# Patient Record
Sex: Male | Born: 1940 | Race: White | Hispanic: No | Marital: Single | Smoking: Former smoker
Health system: Southern US, Community
[De-identification: ages and names within clinical notes are randomized; demographics above are authoritative.]

## PROBLEM LIST (undated history)

## (undated) DIAGNOSIS — M1990 Unspecified osteoarthritis, unspecified site: Secondary | ICD-10-CM

## (undated) DIAGNOSIS — F329 Major depressive disorder, single episode, unspecified: Secondary | ICD-10-CM

## (undated) DIAGNOSIS — F32A Depression, unspecified: Secondary | ICD-10-CM

## (undated) DIAGNOSIS — F419 Anxiety disorder, unspecified: Secondary | ICD-10-CM

## (undated) DIAGNOSIS — F0390 Unspecified dementia without behavioral disturbance: Secondary | ICD-10-CM

## (undated) DIAGNOSIS — I219 Acute myocardial infarction, unspecified: Secondary | ICD-10-CM

## (undated) DIAGNOSIS — R0602 Shortness of breath: Secondary | ICD-10-CM

## (undated) DIAGNOSIS — G8929 Other chronic pain: Secondary | ICD-10-CM

## (undated) HISTORY — PX: CORONARY ANGIOPLASTY WITH STENT PLACEMENT: SHX49

## (undated) HISTORY — PX: PERCUTANEOUS PINNING TOE FRACTURE: SUR1018

## (undated) HISTORY — PX: ANKLE SURGERY: SHX546

---

## 1999-11-24 DIAGNOSIS — I219 Acute myocardial infarction, unspecified: Secondary | ICD-10-CM

## 1999-11-24 HISTORY — PX: CORONARY STENT PLACEMENT: SHX1402

## 1999-11-24 HISTORY — DX: Acute myocardial infarction, unspecified: I21.9

## 2010-09-05 ENCOUNTER — Encounter: Payer: Self-pay | Admitting: Urgent Care

## 2010-12-23 NOTE — Letter (Signed)
Summary: TRIAGE ORDER  TRIAGE ORDER   Imported By: Ave Filter 09/05/2010 16:28:55  _____________________________________________________________________  External Attachment:    Type:   Image     Comment:   External Document  Appended Document: TRIAGE ORDER Procedure will need to be done in OR due to polypharmacy  Appended Document: TRIAGE ORDER Pt's son states his fateher cant be ptu to sleep will need an av to discuss.

## 2011-06-11 ENCOUNTER — Inpatient Hospital Stay (HOSPITAL_COMMUNITY): Admission: RE | Admit: 2011-06-11 | Payer: Self-pay | Source: Ambulatory Visit

## 2011-06-15 ENCOUNTER — Ambulatory Visit (HOSPITAL_COMMUNITY): Admission: RE | Admit: 2011-06-15 | Payer: Medicare Other | Source: Ambulatory Visit | Admitting: Urology

## 2011-06-15 ENCOUNTER — Encounter (HOSPITAL_COMMUNITY): Admission: RE | Payer: Self-pay | Source: Ambulatory Visit

## 2011-06-15 SURGERY — MEATOTOMY, URETHRA, ADULT
Anesthesia: Choice

## 2011-12-16 ENCOUNTER — Other Ambulatory Visit: Payer: Self-pay

## 2011-12-16 ENCOUNTER — Emergency Department (HOSPITAL_COMMUNITY): Payer: Medicare Other

## 2011-12-16 ENCOUNTER — Emergency Department (HOSPITAL_COMMUNITY)
Admission: EM | Admit: 2011-12-16 | Discharge: 2011-12-16 | Disposition: A | Discharge status: Home / Self Care | Payer: Medicare Other | Attending: Emergency Medicine | Admitting: Emergency Medicine

## 2011-12-16 ENCOUNTER — Encounter (HOSPITAL_COMMUNITY): Payer: Self-pay

## 2011-12-16 DIAGNOSIS — F329 Major depressive disorder, single episode, unspecified: Secondary | ICD-10-CM | POA: Insufficient documentation

## 2011-12-16 DIAGNOSIS — Z0389 Encounter for observation for other suspected diseases and conditions ruled out: Secondary | ICD-10-CM | POA: Insufficient documentation

## 2011-12-16 DIAGNOSIS — R531 Weakness: Secondary | ICD-10-CM

## 2011-12-16 DIAGNOSIS — F411 Generalized anxiety disorder: Secondary | ICD-10-CM | POA: Insufficient documentation

## 2011-12-16 DIAGNOSIS — F3289 Other specified depressive episodes: Secondary | ICD-10-CM | POA: Insufficient documentation

## 2011-12-16 HISTORY — DX: Anxiety disorder, unspecified: F41.9

## 2011-12-16 HISTORY — DX: Depression, unspecified: F32.A

## 2011-12-16 HISTORY — DX: Other chronic pain: G89.29

## 2011-12-16 HISTORY — DX: Major depressive disorder, single episode, unspecified: F32.9

## 2011-12-16 LAB — DIFFERENTIAL
Lymphocytes Relative: 29 % (ref 12–46)
Monocytes Absolute: 0.9 10*3/uL (ref 0.1–1.0)
Monocytes Relative: 10 % (ref 3–12)
Neutro Abs: 5.3 10*3/uL (ref 1.7–7.7)

## 2011-12-16 LAB — COMPREHENSIVE METABOLIC PANEL
AST: 25 U/L (ref 0–37)
BUN: 9 mg/dL (ref 6–23)
CO2: 38 mEq/L — ABNORMAL HIGH (ref 19–32)
Chloride: 96 mEq/L (ref 96–112)
Creatinine, Ser: 0.56 mg/dL (ref 0.50–1.35)
GFR calc non Af Amer: >90 mL/min (ref >90)
Total Bilirubin: 0.3 mg/dL (ref 0.3–1.2)

## 2011-12-16 LAB — CBC
HCT: 46 % (ref 39.0–52.0)
Hemoglobin: 15 g/dL (ref 13.0–17.0)
RBC: 4.57 MIL/uL (ref 4.22–5.81)
WBC: 8.9 10*3/uL (ref 4.0–10.5)

## 2011-12-16 LAB — RAPID URINE DRUG SCREEN, HOSP PERFORMED
Opiates: NOT DETECTED
Tetrahydrocannabinol: NOT DETECTED

## 2011-12-16 LAB — ETHANOL: Alcohol, Ethyl (B): <11 mg/dL (ref 0–11)
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
## 2011-12-16 NOTE — ED Notes (Signed)
Pt resting quietly. Cooperative.   No needs verbalized at this time.

## 2011-12-16 NOTE — ED Notes (Signed)
Manuel Bradley with ACT team at bedside for evaluation.

## 2011-12-16 NOTE — ED Notes (Signed)
Contact name Danley Danker Number:  [phone]

## 2011-12-16 NOTE — ED Notes (Signed)
Pt brought here by a friend.  Friend says for the past month pt has lost approx 20lbs, has been having visual and auditory hallucinations.  Reports can't remember anything, will fall asleep standing up.   Pt denies any SI or HI.
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
## 2011-12-16 NOTE — ED Notes (Signed)
Dr Judd Lien at bedside, explained to pt that he will be discharged.  Pt aware to follow up with case management tomorrow.  Contact information provided to pt.  Personal belongings returned to pt.

## 2011-12-16 NOTE — ED Notes (Signed)
Consulted with [employer] and voucher arranged for. Pt discharged to home.

## 2011-12-16 NOTE — ED Notes (Signed)
Spoke with pt's friend, Danley Danker, [phone] who brought pt to ED. Explained to Mr Hipkins that pt has been discharged as there is no medical reason to hold or admit pt.  Mr. Mearl Latin stated he would not come pick pt up, and would be unable to do so until at least noon tomorrow.

## 2011-12-16 NOTE — ED Notes (Signed)
Pt states that he has been seeing and hearing things x 3 weeks. Pt states that for example he was asleep and woke up and saw his best friend looking at him but when he got up the front door was locked. Pt also hears people talking to him at times. Pt alert and oriented x 3. Skin warm and dry. Color pink. Pt ambulates with no difficulty. Denies suicidal or homicidal ideations.

## 2011-12-17 NOTE — Progress Notes (Signed)
I was the case manager on call on 1/23 and I received a call from Dr Judd Lien from [facility] ED wanting me to help pt with some assistance at home. I called pt this AM to speak with him about what assistance he needs. A male answered the phone and stated that pt did not live there anymore. I asked if he had Mr Beazley's contact and he stated no. I am hence unable to follow up with patient as there is only one contact # in the records and that is the one I called.

## 2012-05-17 ENCOUNTER — Encounter (HOSPITAL_COMMUNITY): Payer: Self-pay | Admitting: *Deleted

## 2012-05-17 ENCOUNTER — Emergency Department (HOSPITAL_COMMUNITY)
Admission: EM | Admit: 2012-05-17 | Discharge: 2012-05-17 | Disposition: A | Discharge status: Home / Self Care | Payer: Medicare Other | Attending: Emergency Medicine | Admitting: Emergency Medicine

## 2012-05-17 DIAGNOSIS — L02413 Cutaneous abscess of right upper limb: Secondary | ICD-10-CM

## 2012-05-17 DIAGNOSIS — G8929 Other chronic pain: Secondary | ICD-10-CM | POA: Insufficient documentation

## 2012-05-17 DIAGNOSIS — Z87891 Personal history of nicotine dependence: Secondary | ICD-10-CM | POA: Insufficient documentation

## 2012-05-17 DIAGNOSIS — IMO0002 Reserved for concepts with insufficient information to code with codable children: Secondary | ICD-10-CM | POA: Insufficient documentation

## 2012-05-17 MED ORDER — SULFAMETHOXAZOLE-TRIMETHOPRIM 800-160 MG PO TABS: 1.0000 | ORAL_TABLET | Freq: Two times a day (BID) | ORAL | Status: AC

## 2012-05-17 MED ORDER — SULFAMETHOXAZOLE-TMP DS 800-160 MG PO TABS
1.0000 | ORAL_TABLET | Freq: Once | ORAL | Status: AC
  Administered 2012-05-17: 1 via ORAL
  Filled 2012-05-17: qty 1

## 2012-05-17 NOTE — ED Notes (Signed)
Patient with no complaints at this time. Respirations even and unlabored. Skin warm/dry. Discharge instructions reviewed with patient at this time. Patient given opportunity to voice concerns/ask questions. Patient discharged at this time and left Emergency Department with steady gait.   

## 2012-05-17 NOTE — ED Notes (Addendum)
Pt c/o ?abscess to right  forearm area that he noticed yesterday. Unsure of what happened. Pt also concerned that his blood pressure is reading low at triage 113/45

## 2012-05-17 NOTE — Discharge Instructions (Signed)
Abscess An abscess (boil or furuncle) is an infected area under your skin. This area is filled with yellowish white fluid (pus). HOME CARE   Only take medicine as told by your doctor.   Keep the skin clean around your abscess. Keep clothes that may touch the abscess clean.   Change any bandages (dressings) as told by your doctor.   Avoid direct skin contact with other people. The infection can spread by skin contact with others.   Practice good hygiene and do not share personal care items.   Do not share athletic equipment, towels, or whirlpools. Shower after every practice or work out session.   If a draining area cannot be covered:   Do not play sports.   Children should not go to daycare until the wound has healed or until fluid (drainage) stops coming out of the wound.   See your doctor for a follow-up visit as told.  GET HELP RIGHT AWAY IF:   There is more pain, puffiness (swelling), and redness in the wound site.   There is fluid or bleeding from the wound site.   You have muscle aches, chills, fever, or feel sick.   You or your child has a temperature by mouth above 102 F (38.9 C), not controlled by medicine.   Your baby is older than 3 months with a rectal temperature of 102 F (38.9 C) or higher.  MAKE SURE YOU:   Understand these instructions.   Will watch your condition.   Will get help right away if you are not doing well or get worse.  Document Released: 04/27/2008 Document Revised: 10/29/2011 Document Reviewed: 04/27/2008 [facility] Patient Information 2012 [place], [place].  Antibiotic for 10 days. Keep area very clean. Moist hot pack to arm. Followup your primary care Dr.

## 2012-05-17 NOTE — ED Provider Notes (Signed)
This chart was scribed for Donnetta Hutching, MD by Wallis Mart. The patient was seen in room APA12/APA12 and the patient's care was started at 3:15 PM.   CSN: [id number]  Arrival date & time 05/17/12  1450   First MD Initiated Contact with Patient 05/17/12 1507      Chief Complaint  Patient presents with  . Abscess    (Consider location/radiation/quality/duration/timing/severity/associated sxs/prior treatment) HPI  Manuel Bradley is a 71 y.o. male who presents to the Emergency Department complaining of an abscess on his right forearm that he first noticed yesterday. Pt is unsure of how the abscess was acquired.  Pt denies swelling to armpit but c/o purulent discharge from right armpit. There are no other associated symptoms and no other alleviating or aggravating factors.   Past Medical History  Diagnosis Date  . Depression   . Anxiety   . Chronic pain     Past Surgical History  Procedure Date  . Coronary angioplasty with stent placement   . Ankle surgery     No family history on file.  History  Substance Use Topics  . Smoking status: Former Games developer  . Smokeless tobacco: Not on file  . Alcohol Use: Yes     socially      Review of Systems  10 Systems reviewed and all are negative for acute change except as noted in the HPI.    Allergies  Review of patient's allergies indicates no known allergies.  Home Medications   Current Outpatient Rx  Name Route Sig Dispense Refill  . ALBUTEROL SULFATE HFA 108 (90 BASE) MCG/ACT IN AERS Inhalation Inhale 2 puffs into the lungs every 4 (four) hours as needed. For rescue/shortness of breath    . FLUTICASONE PROPIONATE 50 MCG/ACT NA SUSP Nasal Place 2 sprays into the nose daily.    Marland Kitchen HYDROCHLOROTHIAZIDE 12.5 MG PO CAPS Oral Take 12.5 mg by mouth daily.    Marland Kitchen NABUMETONE 750 MG PO TABS Oral Take 750 mg by mouth 2 (two) times daily.    Marland Kitchen PRIMIDONE 50 MG PO TABS Oral Take 50 mg by mouth 2 (two) times daily.    Marland Kitchen TAMSULOSIN HCL 0.4  MG PO CAPS Oral Take 0.4 mg by mouth daily.    . TRAZODONE HCL 150 MG PO TABS Oral Take 150 mg by mouth at bedtime.      BP 113/45  Pulse 78  Temp 98.4 F (36.9 C) (Oral)  Resp 18  Ht 6\' 2"  (1.88 m)  Wt 174 lb (78.926 kg)  BMI 22.34 kg/m2  SpO2 100%  Physical Exam  Nursing note and vitals reviewed. Constitutional: He is oriented to person, place, and time. He appears well-developed and well-nourished. No distress.  HENT:  Head: Normocephalic and atraumatic.  Eyes: EOM are normal. Pupils are equal, round, and reactive to light.  Neck: Normal range of motion. Neck supple. No tracheal deviation present.  Cardiovascular: Normal rate and regular rhythm.   Pulmonary/Chest: Effort normal and breath sounds normal. No respiratory distress.  Abdominal: Soft. He exhibits no distension.  Musculoskeletal: Normal range of motion. He exhibits no edema.       On  proximal aspect or right forearm is an area of induration approx 2 cm in diameter with some associated erythema   Neurological: He is alert and oriented to person, place, and time. No sensory deficit.  Skin: Skin is warm and dry.  Psychiatric: He has a normal mood and affect. His behavior is normal.    ED  Course  Procedures (including critical care time)  DIAGNOSTIC STUDIES: Oxygen Saturation is 100% on room air, normal by my interpretation.    COORDINATION OF CARE:    3:20 PM: Abscess does not need to be lanced but Pt to take an oral antibiotic (Septra) for one week to 10 days, 2 X daily. Pt advised to keep area very clean.    Labs Reviewed - No data to display No results found.   No diagnosis found.    MDM  Minor abscess to right proximal forearm. No I&D necessary. Rx Septra for 10 days   I personally performed the services described in this documentation, which was scribed in my presence. The recorded information has been reviewed and considered.         Donnetta Hutching, MD 05/17/12 1536

## 2012-06-02 NOTE — H&P (Signed)
  NTS SOAP Note  Vital Signs:  Vitals as of: 06/02/2012: Systolic 124: Diastolic 69: Heart Rate 61: Temp 98.10F: Height 7ft 11.5in: Weight 183Lbs 0 Ounces: Pain Level 8: BMI 25  BMI : 25.17 kg/m2  Subjective: This 71 Years 34 Months old Male presents for of    HERNIA: ,Referred by urology for treatment of a right inguinal hernia.  Patient states it has been there for some time, but has increased in size and is causing him discomfort.  Review of Symptoms:    thirsty Head:unremarkable    Eyes:unremarkable   Nose/Mouth/Throat:unremarkable Cardiovascular:  unremarkable   Respiratory:unremarkable   Gastrointestinal:  unremarkable   Genitourinary:    dysuria,urgency,frequency   joint and neck pain dry Hematolgic/Lymphatic:unremarkable     Allergic/Immunologic:unremarkable     Past Medical History:    Reviewed   Past Medical History  Surgical History: ankle surgery, CAD stents ten years ago Medical Problems:  Asthma, High Blood pressure Allergies: nkda Medications: fluticasone, ventolin, tamsulosin, HCTZ   Social History:Reviewed  Social History  Preferred Language: English ([place]) Race:  White Ethnicity: Not Hispanic / Latino Age: 71 Years 7 Months Alcohol: none Recreational drug(s): no   Smoking Status: Former smoker reviewed on 06/02/2012 Started Date: 11/23/1959 Stopped Date: 03/23/2012   Family History:  Reviewed   Family History      Not Available    Objective Information: General:  Well appearing, well nourished in no distress. Neck:  Supple without lymphadenopathy.  Heart:  RRR, no murmur Lungs:    CTA bilaterally, no wheezes, rhonchi, rales.  Breathing unlabored. Abdomen:Soft, NT/ND, no HSM, no masses.  Large reducible right inguinal hernia noted.  No left inguinal hernia.  Assessment:Right inguinal hernia  Diagnosis &amp; Procedure: DiagnosisCode: 550.90, ProcedureCode:  16109,    Plan:Scheduled for right inguinal herniorrhaphy on 06/15/12.  Dr. Jerre Simon of Urology will perform urethral surgery at same time.   Patient Education:Alternative treatments to surgery were discussed with patient (and family).  Risks and benefits  of procedure were fully explained to the patient (and family) who gave informed consent. Patient/family questions were addressed.  Follow-up:Pending Surgery

## 2012-06-08 ENCOUNTER — Encounter (HOSPITAL_COMMUNITY): Payer: Self-pay | Admitting: Pharmacy Technician

## 2012-06-08 ENCOUNTER — Other Ambulatory Visit: Payer: Self-pay

## 2012-06-08 ENCOUNTER — Encounter (HOSPITAL_COMMUNITY): Payer: Self-pay

## 2012-06-08 ENCOUNTER — Encounter (HOSPITAL_COMMUNITY)
Admission: RE | Admit: 2012-06-08 | Discharge: 2012-06-08 | Disposition: A | Discharge status: Home / Self Care | Payer: Medicare Other | Source: Ambulatory Visit | Attending: General Surgery | Admitting: General Surgery

## 2012-06-08 HISTORY — DX: Unspecified dementia, unspecified severity, without behavioral disturbance, psychotic disturbance, mood disturbance, and anxiety: F03.90

## 2012-06-08 HISTORY — DX: Shortness of breath: R06.02

## 2012-06-08 HISTORY — DX: Unspecified osteoarthritis, unspecified site: M19.90

## 2012-06-08 HISTORY — DX: Acute myocardial infarction, unspecified: I21.9

## 2012-06-08 LAB — DIFFERENTIAL
Basophils Absolute: 0 10*3/uL (ref 0.0–0.1)
Eosinophils Relative: 3 % (ref 0–5)
Lymphocytes Relative: 30 % (ref 12–46)
Monocytes Absolute: 1.1 10*3/uL — ABNORMAL HIGH (ref 0.1–1.0)

## 2012-06-08 LAB — CBC
HCT: 39.6 % (ref 39.0–52.0)
MCV: 100 fL (ref 78.0–100.0)
RDW: 12.9 % (ref 11.5–15.5)
WBC: 8.3 10*3/uL (ref 4.0–10.5)

## 2012-06-08 LAB — BASIC METABOLIC PANEL
BUN: 8 mg/dL (ref 6–23)
Chloride: 103 mEq/L (ref 96–112)
GFR calc Af Amer: >90 mL/min (ref >90)
Potassium: 3.6 mEq/L (ref 3.5–5.1)
Sodium: 143 mEq/L (ref 135–145)

## 2012-06-08 NOTE — Patient Instructions (Addendum)
20 Josealberto Montalto  06/08/2012   Your procedure is scheduled on:  Wednesday, 06/15/12  Report to [facility] at 1030 AM.  Call this number if you have problems the morning of surgery: [phone]   Remember:   Do not eat food:After Midnight.  May have clear liquids:until Midnight .  Clear liquids include soda, tea, black coffee, apple or grape juice, broth.  Take these medicines the morning of surgery with A SIP OF WATER: primidone, albuterol. Please bring inhaler.   Do not wear jewelry, make-up or nail polish.  Do not wear lotions, powders, or perfumes. You may wear deodorant.  Do not shave 48 hours prior to surgery. Men may shave face and neck.  Do not bring valuables to the hospital.  Contacts, dentures or bridgework may not be worn into surgery.  Leave suitcase in the car. After surgery it may be brought to your room.  For patients admitted to the hospital, checkout time is 11:00 AM the day of discharge.   Patients discharged the day of surgery will not be allowed to drive home.  Name and phone number of your driver: family  Special Instructions: CHG Shower Use Special Wash: 1/2 bottle night before surgery and 1/2 bottle morning of surgery.   Please read over the following fact sheets that you were given: Pain Booklet, Coughing and Deep Breathing, MRSA Information, Surgical Site Infection Prevention, Anesthesia Post-op Instructions and Care and Recovery After Surgery   Cystoscopy (Bladder Exam) Care After Refer to this sheet in the next few weeks. These discharge instructions provide you with general information on caring for yourself after you leave the hospital. Your caregiver may also give you specific instructions. Your treatment has been planned according to the most current medical practices available, but unavoidable complications sometimes occur. If you have any problems or questions after discharge, please call your caregiver. AFTER THE PROCEDURE   There may be temporary bleeding  and burning with urination.   Drink enough water and fluids to keep your urine clear or pale yellow.  FINDING OUT THE RESULTS OF YOUR TEST Not all test results are available during your visit. If your test results are not back during the visit, make an appointment with your caregiver to find out the results. Do not assume everything is normal if you have not heard from your caregiver or the medical facility. It is important for you to follow up on all of your test results. SEEK IMMEDIATE MEDICAL CARE IF:   There is an increase in blood in the urine or you are passing clots.   There is difficulty passing urine.   You develop the chills.   You have an oral temperature above 102 F (38.9 C), not controlled by medicine.   Belly (abdominal) pain develops.  Document Released: 05/29/2005 Document Revised: 10/29/2011 Document Reviewed: 03/26/2008 [facility] Patient Information 2012 [place], [place].Hernia Repair Care After These instructions give you information on caring for yourself after your procedure. Your doctor may also give you more specific instructions. Call your doctor if you have any problems or questions after your procedure. HOME CARE   You may have changes in your poops (bowel movements).   You may have loose or watery poop (diarrhea).   You may be not able to poop.   Your bowels will slowly get back to normal.   Do not eat any food that makes you sick to your stomach (nauseous). Eat small meals 4 to 6 times a day instead of 3 large ones.  Do not drink pop. It will give you gas.   Do not drink alcohol.   Do not lift anything heavier than 10 pounds. This is about the weight of a gallon of milk.   Do not do anything that makes you very tired for at least 6 weeks.   Do not get your wound wet for 2 days.   You may take a sponge bath during this time.   After 2 days you may take a shower. Gently pat your surgical cut (incision) dry with a towel. Do not rub it.   For  men: You may have been given an athletic supporter (scrotal support) before you left the hospital. It holds your scrotum and testicles closer to your body so there is no strain on your wound. Wear the supporter until your doctor tells you that you do not need it anymore.  GET HELP RIGHT AWAY IF:  You have watery poop, or cannot poop for more than 3 days.   You feel sick to your stomach or throw up (vomit) more than 2 or 3 times.   You have temperature by mouth above 102 F (38.9 C).   You see redness or puffiness (swelling) around your wound.   You see yellowish white fluid (pus) coming from your wound.   You see a bulge or bump in your lower belly (abdomen) or near your groin.   You develop a rash, trouble breathing, or any other symptoms from medicines taken.  MAKE SURE YOU:  Understand these instructions.   Will watch your condition.   Will get help right away if your are not doing well or get worse.  Document Released: 10/22/2008 Document Revised: 10/29/2011 Document Reviewed: 10/22/2008 [facility] Patient Information 2012 [place], [place].

## 2012-06-15 ENCOUNTER — Ambulatory Visit (HOSPITAL_COMMUNITY): Payer: Medicare Other | Admitting: Anesthesiology

## 2012-06-15 ENCOUNTER — Encounter (HOSPITAL_COMMUNITY)
Admission: RE | Disposition: A | Discharge status: Home / Self Care | Payer: Self-pay | Source: Ambulatory Visit | Attending: General Surgery

## 2012-06-15 ENCOUNTER — Encounter (HOSPITAL_COMMUNITY): Payer: Self-pay | Admitting: *Deleted

## 2012-06-15 ENCOUNTER — Ambulatory Visit (HOSPITAL_COMMUNITY)
Admission: RE | Admit: 2012-06-15 | Discharge: 2012-06-16 | Disposition: A | Discharge status: Home / Self Care | Payer: Medicare Other | Source: Ambulatory Visit | Attending: General Surgery | Admitting: General Surgery

## 2012-06-15 ENCOUNTER — Encounter (HOSPITAL_COMMUNITY): Payer: Self-pay | Admitting: Anesthesiology

## 2012-06-15 DIAGNOSIS — Z79899 Other long term (current) drug therapy: Secondary | ICD-10-CM | POA: Insufficient documentation

## 2012-06-15 DIAGNOSIS — E119 Type 2 diabetes mellitus without complications: Secondary | ICD-10-CM | POA: Insufficient documentation

## 2012-06-15 DIAGNOSIS — G4733 Obstructive sleep apnea (adult) (pediatric): Secondary | ICD-10-CM | POA: Insufficient documentation

## 2012-06-15 DIAGNOSIS — J4489 Other specified chronic obstructive pulmonary disease: Secondary | ICD-10-CM | POA: Insufficient documentation

## 2012-06-15 DIAGNOSIS — IMO0002 Reserved for concepts with insufficient information to code with codable children: Secondary | ICD-10-CM | POA: Insufficient documentation

## 2012-06-15 DIAGNOSIS — K4030 Unilateral inguinal hernia, with obstruction, without gangrene, not specified as recurrent: Secondary | ICD-10-CM | POA: Insufficient documentation

## 2012-06-15 DIAGNOSIS — Z01812 Encounter for preprocedural laboratory examination: Secondary | ICD-10-CM | POA: Insufficient documentation

## 2012-06-15 DIAGNOSIS — I10 Essential (primary) hypertension: Secondary | ICD-10-CM | POA: Insufficient documentation

## 2012-06-15 DIAGNOSIS — J449 Chronic obstructive pulmonary disease, unspecified: Secondary | ICD-10-CM | POA: Insufficient documentation

## 2012-06-15 DIAGNOSIS — F0390 Unspecified dementia without behavioral disturbance: Secondary | ICD-10-CM | POA: Insufficient documentation

## 2012-06-15 HISTORY — PX: MEATOTOMY: SHX5133

## 2012-06-15 HISTORY — PX: CYSTOSCOPY: SHX5120

## 2012-06-15 HISTORY — PX: INGUINAL HERNIA REPAIR: SHX194

## 2012-06-15 SURGERY — MEATOTOMY, URETHRA, ADULT
Anesthesia: Spinal | Site: Penis | Laterality: Right | Wound class: Clean Contaminated

## 2012-06-15 MED ORDER — BACITRACIN-NEOMYCIN-POLYMYXIN 400-5-5000 EX OINT
TOPICAL_OINTMENT | CUTANEOUS | Status: AC
  Filled 2012-06-15: qty 1

## 2012-06-15 MED ORDER — ENOXAPARIN SODIUM 40 MG/0.4ML ~~LOC~~ SOLN
40.0000 mg | Freq: Once | SUBCUTANEOUS | Status: AC
  Administered 2012-06-15: 40 mg via SUBCUTANEOUS

## 2012-06-15 MED ORDER — BUPIVACAINE HCL (PF) 0.5 % IJ SOLN
INTRAMUSCULAR | Status: AC
  Filled 2012-06-15: qty 30

## 2012-06-15 MED ORDER — HYDROMORPHONE HCL PF 1 MG/ML IJ SOLN
0.5000 mg | INTRAMUSCULAR | Status: DC | PRN
  Administered 2012-06-15 (×2): 0.5 mg via INTRAVENOUS
  Filled 2012-06-15 (×2): qty 1

## 2012-06-15 MED ORDER — POVIDONE-IODINE 10 % EX OINT
TOPICAL_OINTMENT | CUTANEOUS | Status: AC
  Filled 2012-06-15: qty 1

## 2012-06-15 MED ORDER — FENTANYL CITRATE 0.05 MG/ML IJ SOLN
INTRAMUSCULAR | Status: DC | PRN
  Administered 2012-06-15: 25 ug via INTRAVENOUS

## 2012-06-15 MED ORDER — PROPOFOL 10 MG/ML IV EMUL
INTRAVENOUS | Status: DC | PRN
  Administered 2012-06-15: 35 ug/kg/min via INTRAVENOUS

## 2012-06-15 MED ORDER — VITAMIN B-1 100 MG PO TABS
100.0000 mg | ORAL_TABLET | Freq: Every day | ORAL | Status: DC
  Administered 2012-06-15 – 2012-06-16 (×2): 100 mg via ORAL
  Filled 2012-06-15 (×2): qty 1

## 2012-06-15 MED ORDER — ACETAMINOPHEN 10 MG/ML IV SOLN
1000.0000 mg | Freq: Four times a day (QID) | INTRAVENOUS | Status: DC
  Administered 2012-06-15 – 2012-06-16 (×3): 1000 mg via INTRAVENOUS
  Filled 2012-06-15 (×3): qty 100

## 2012-06-15 MED ORDER — ACETAMINOPHEN 10 MG/ML IV SOLN
INTRAVENOUS | Status: AC
  Filled 2012-06-15: qty 100

## 2012-06-15 MED ORDER — PROPOFOL 10 MG/ML IV EMUL
INTRAVENOUS | Status: AC
  Filled 2012-06-15: qty 20

## 2012-06-15 MED ORDER — HYDROMORPHONE HCL PF 1 MG/ML IJ SOLN
1.0000 mg | INTRAMUSCULAR | Status: DC | PRN
  Administered 2012-06-15 – 2012-06-16 (×3): 1 mg via INTRAVENOUS
  Filled 2012-06-15 (×3): qty 1

## 2012-06-15 MED ORDER — EPHEDRINE SULFATE 50 MG/ML IJ SOLN
INTRAMUSCULAR | Status: AC
  Filled 2012-06-15: qty 1

## 2012-06-15 MED ORDER — MIDAZOLAM HCL 2 MG/2ML IJ SOLN
INTRAMUSCULAR | Status: AC
  Filled 2012-06-15: qty 2

## 2012-06-15 MED ORDER — ENOXAPARIN SODIUM 40 MG/0.4ML ~~LOC~~ SOLN: 40.0000 mg | SUBCUTANEOUS | Status: DC

## 2012-06-15 MED ORDER — FENTANYL CITRATE 0.05 MG/ML IJ SOLN
INTRAMUSCULAR | Status: AC
  Filled 2012-06-15: qty 2

## 2012-06-15 MED ORDER — BUPIVACAINE IN DEXTROSE 0.75-8.25 % IT SOLN
INTRATHECAL | Status: DC | PRN
  Administered 2012-06-15: 15 mg via INTRATHECAL

## 2012-06-15 MED ORDER — EPHEDRINE SULFATE 50 MG/ML IJ SOLN
INTRAMUSCULAR | Status: DC | PRN
  Administered 2012-06-15: 5 mg via INTRAVENOUS
  Administered 2012-06-15: 10 mg via INTRAVENOUS

## 2012-06-15 MED ORDER — TAMSULOSIN HCL 0.4 MG PO CAPS
0.4000 mg | ORAL_CAPSULE | Freq: Every day | ORAL | Status: DC
  Administered 2012-06-15 – 2012-06-16 (×2): 0.4 mg via ORAL
  Filled 2012-06-15 (×2): qty 1

## 2012-06-15 MED ORDER — FENTANYL CITRATE 0.05 MG/ML IJ SOLN: 25.0000 ug | INTRAMUSCULAR | Status: DC | PRN

## 2012-06-15 MED ORDER — LACTATED RINGERS IV SOLN
INTRAVENOUS | Status: DC
  Administered 2012-06-15 – 2012-06-16 (×2): via INTRAVENOUS

## 2012-06-15 MED ORDER — MIDAZOLAM HCL 2 MG/2ML IJ SOLN
1.0000 mg | INTRAMUSCULAR | Status: DC | PRN
  Administered 2012-06-15: 2 mg via INTRAVENOUS

## 2012-06-15 MED ORDER — SODIUM CHLORIDE 0.9 % IR SOLN
Status: DC | PRN
  Administered 2012-06-15: 3000 mL via INTRAVESICAL

## 2012-06-15 MED ORDER — PRIMIDONE 50 MG PO TABS
50.0000 mg | ORAL_TABLET | Freq: Two times a day (BID) | ORAL | Status: DC
  Administered 2012-06-15 – 2012-06-16 (×2): 50 mg via ORAL
  Filled 2012-06-15 (×4): qty 1

## 2012-06-15 MED ORDER — BUPIVACAINE IN DEXTROSE 0.75-8.25 % IT SOLN
INTRATHECAL | Status: AC
  Filled 2012-06-15: qty 2

## 2012-06-15 MED ORDER — POVIDONE-IODINE 10 % OINT PACKET
TOPICAL_OINTMENT | CUTANEOUS | Status: DC | PRN
  Administered 2012-06-15: 1 via TOPICAL

## 2012-06-15 MED ORDER — MIDAZOLAM HCL 5 MG/5ML IJ SOLN
INTRAMUSCULAR | Status: DC | PRN
  Administered 2012-06-15 (×2): 0.5 mg via INTRAVENOUS
  Administered 2012-06-15: 1 mg via INTRAVENOUS

## 2012-06-15 MED ORDER — 0.9 % SODIUM CHLORIDE (POUR BTL) OPTIME
TOPICAL | Status: DC | PRN
  Administered 2012-06-15: 1000 mL

## 2012-06-15 MED ORDER — FENTANYL CITRATE 0.05 MG/ML IJ SOLN
INTRAMUSCULAR | Status: DC | PRN
  Administered 2012-06-15: 25 ug via INTRATHECAL

## 2012-06-15 MED ORDER — TRAZODONE HCL 50 MG PO TABS
150.0000 mg | ORAL_TABLET | Freq: Every day | ORAL | Status: DC
  Administered 2012-06-15: 150 mg via ORAL
  Filled 2012-06-15: qty 3

## 2012-06-15 MED ORDER — CEFAZOLIN SODIUM-DEXTROSE 2-3 GM-% IV SOLR
2.0000 g | INTRAVENOUS | Status: AC
  Administered 2012-06-15: 2 g via INTRAVENOUS

## 2012-06-15 MED ORDER — ONDANSETRON HCL 4 MG/2ML IJ SOLN: 4.0000 mg | Freq: Once | INTRAMUSCULAR | Status: DC | PRN

## 2012-06-15 MED ORDER — ONDANSETRON HCL 4 MG/2ML IJ SOLN: 4.0000 mg | Freq: Four times a day (QID) | INTRAMUSCULAR | Status: DC | PRN

## 2012-06-15 MED ORDER — FLUTICASONE PROPIONATE 50 MCG/ACT NA SUSP
2.0000 | Freq: Every day | NASAL | Status: DC
  Administered 2012-06-15 – 2012-06-16 (×2): 2 via NASAL
  Filled 2012-06-15: qty 16

## 2012-06-15 MED ORDER — ALBUTEROL SULFATE HFA 108 (90 BASE) MCG/ACT IN AERS: 2.0000 | INHALATION_SPRAY | RESPIRATORY_TRACT | Status: DC | PRN

## 2012-06-15 MED ORDER — CEFAZOLIN SODIUM-DEXTROSE 2-3 GM-% IV SOLR
INTRAVENOUS | Status: AC
  Filled 2012-06-15: qty 50

## 2012-06-15 MED ORDER — HYDROCHLOROTHIAZIDE 12.5 MG PO CAPS
12.5000 mg | ORAL_CAPSULE | Freq: Every day | ORAL | Status: DC
  Administered 2012-06-15 – 2012-06-16 (×2): 12.5 mg via ORAL
  Filled 2012-06-15 (×2): qty 1

## 2012-06-15 MED ORDER — ENOXAPARIN SODIUM 40 MG/0.4ML ~~LOC~~ SOLN
SUBCUTANEOUS | Status: AC
  Filled 2012-06-15: qty 0.4

## 2012-06-15 MED ORDER — BUPIVACAINE HCL (PF) 0.5 % IJ SOLN
INTRAMUSCULAR | Status: DC | PRN
  Administered 2012-06-15: 7 mL

## 2012-06-15 MED ORDER — ONDANSETRON HCL 4 MG PO TABS: 4.0000 mg | ORAL_TABLET | Freq: Four times a day (QID) | ORAL | Status: DC | PRN

## 2012-06-15 MED ORDER — LACTATED RINGERS IV SOLN
INTRAVENOUS | Status: DC
  Administered 2012-06-15: 1000 mL via INTRAVENOUS

## 2012-06-15 SURGICAL SUPPLY — 63 items
BAG HAMPER (MISCELLANEOUS) ×6 IMPLANT
CATH FOLEY 2WAY SLVR  5CC 18FR (CATHETERS)
CATH FOLEY 2WAY SLVR 5CC 18FR (CATHETERS) IMPLANT
CLOTH BEACON ORANGE TIMEOUT ST (SAFETY) ×6 IMPLANT
COVER LIGHT HANDLE STERIS (MISCELLANEOUS) ×12 IMPLANT
DECANTER SPIKE VIAL GLASS SM (MISCELLANEOUS) ×6 IMPLANT
DERMABOND ADVANCED (GAUZE/BANDAGES/DRESSINGS)
DERMABOND ADVANCED .7 DNX12 (GAUZE/BANDAGES/DRESSINGS) IMPLANT
DRAIN PENROSE 18X.75 LTX STRL (MISCELLANEOUS) ×6 IMPLANT
DRAPE LAPAROTOMY TRNSV 102X78 (DRAPE) ×6 IMPLANT
DRAPE PROXIMA HALF (DRAPES) ×6 IMPLANT
DRAPE UTILITY W/TAPE 26X15 (DRAPES) ×12 IMPLANT
ELECT REM PT RETURN 9FT ADLT (ELECTROSURGICAL) ×6
ELECTRODE REM PT RTRN 9FT ADLT (ELECTROSURGICAL) ×5 IMPLANT
FORMALIN 10 PREFIL 120ML (MISCELLANEOUS) IMPLANT
GAUZE SPONGE 4X4 16PLY XRAY LF (GAUZE/BANDAGES/DRESSINGS) ×6 IMPLANT
GLOVE BIO SURGEON STRL SZ7 (GLOVE) ×6 IMPLANT
GLOVE BIO SURGEON STRL SZ7.5 (GLOVE) ×6 IMPLANT
GLOVE BIOGEL PI IND STRL 7.0 (GLOVE) ×20 IMPLANT
GLOVE BIOGEL PI INDICATOR 7.0 (GLOVE) ×4
GLOVE ECLIPSE 6.5 STRL STRAW (GLOVE) ×24 IMPLANT
GLOVE EXAM NITRILE MD LF STRL (GLOVE) ×6 IMPLANT
GLOVE OPTIFIT SS 6.5 STRL BRWN (GLOVE) ×6 IMPLANT
GOWN STRL REIN XL XLG (GOWN DISPOSABLE) ×36 IMPLANT
INST SET MINOR GENERAL (KITS) ×6 IMPLANT
IV NS IRRIG 3000ML ARTHROMATIC (IV SOLUTION) ×6 IMPLANT
KIT BLADEGUARD II DBL (SET/KITS/TRAYS/PACK) ×6 IMPLANT
KIT ROOM TURNOVER AP CYSTO (KITS) ×6 IMPLANT
KIT ROOM TURNOVER APOR (KITS) IMPLANT
LUBRICANT JELLY 4.5OZ STERILE (MISCELLANEOUS) ×6 IMPLANT
MANIFOLD NEPTUNE II (INSTRUMENTS) ×6 IMPLANT
MESH HERNIA 1.6X1.9 PLUG LRG (Mesh General) ×5 IMPLANT
MESH HERNIA PLUG LRG (Mesh General) ×1 IMPLANT
NEEDLE HYPO 25X1 1.5 SAFETY (NEEDLE) ×6 IMPLANT
NS IRRIG 1000ML POUR BTL (IV SOLUTION) ×6 IMPLANT
PACK BASIC III (CUSTOM PROCEDURE TRAY) ×1
PACK CYSTO (CUSTOM PROCEDURE TRAY) ×6 IMPLANT
PACK MINOR (CUSTOM PROCEDURE TRAY) IMPLANT
PACK PERI GYN (CUSTOM PROCEDURE TRAY) ×6 IMPLANT
PACK SRG BSC III STRL LF ECLPS (CUSTOM PROCEDURE TRAY) ×5 IMPLANT
PAD ARMBOARD 7.5X6 YLW CONV (MISCELLANEOUS) ×6 IMPLANT
SET BASIN LINEN APH (SET/KITS/TRAYS/PACK) ×6 IMPLANT
SET IRRIG Y TYPE TUR BLADDER L (SET/KITS/TRAYS/PACK) ×6 IMPLANT
SOL PREP PROV IODINE SCRUB 4OZ (MISCELLANEOUS) ×6 IMPLANT
SPONGE GAUZE 4X4 12PLY (GAUZE/BANDAGES/DRESSINGS) ×6 IMPLANT
STAPLER VISISTAT (STAPLE) ×6 IMPLANT
SUT CHROMIC 3 0 SH 27 (SUTURE) ×6 IMPLANT
SUT NOVA NAB GS-22 2 2-0 T-19 (SUTURE) ×24 IMPLANT
SUT NOVAFIL NAB HGS22 2-0 30IN (SUTURE) IMPLANT
SUT SILK 3 0 (SUTURE)
SUT SILK 3-0 18XBRD TIE 12 (SUTURE) IMPLANT
SUT VIC AB 2-0 CT1 27 (SUTURE) ×1
SUT VIC AB 2-0 CT1 TAPERPNT 27 (SUTURE) ×5 IMPLANT
SUT VIC AB 3-0 SH 27 (SUTURE) ×1
SUT VIC AB 3-0 SH 27X BRD (SUTURE) ×5 IMPLANT
SUT VIC AB 4-0 PS2 27 (SUTURE) ×6 IMPLANT
SUT VICRYL AB 3 0 TIES (SUTURE) ×6 IMPLANT
SYR CONTROL 10ML LL (SYRINGE) IMPLANT
SYRINGE IRR TOOMEY STRL 70CC (SYRINGE) IMPLANT
TAPE CLOTH SURG 4X10 WHT LF (GAUZE/BANDAGES/DRESSINGS) ×6 IMPLANT
TOWEL OR 17X26 4PK STRL BLUE (TOWEL DISPOSABLE) ×6 IMPLANT
TRAY FOLEY CATH 14FR (SET/KITS/TRAYS/PACK) ×6 IMPLANT
VALVE DISPOSABLE (MISCELLANEOUS) ×6 IMPLANT

## 2012-06-15 NOTE — Progress Notes (Signed)
Awake. No scrotal edema. String from meatus. No drainage from meatus. Ice pack to RLQ. Denies pain.

## 2012-06-15 NOTE — Interval H&P Note (Signed)
History and Physical Interval Note:  06/15/2012 11:58 AM  Manuel Bradley  has presented today for surgery, with the diagnosis of right inguinal hernia, meatal stenosis  The various methods of treatment have been discussed with the patient and family. After consideration of risks, benefits and other options for treatment, the patient has consented to  Procedure(s) (LRB): HERNIA REPAIR INGUINAL ADULT (Right) CYSTOSCOPY (N/A) MEATOTOMY ADULT (N/A) as a surgical intervention .  The patient's history has been reviewed, patient examined, no change in status, stable for surgery.  I have reviewed the patient's chart and labs.  Questions were answered to the patient's satisfaction.     Franky Macho A

## 2012-06-15 NOTE — Progress Notes (Signed)
Pt in RM 316. Belongings placed at bedside. Gold and diamond ring given to pt and pt placed on lt ring finger. Clothes, shoes and suit jacket at bedside.

## 2012-06-15 NOTE — Anesthesia Preprocedure Evaluation (Signed)
Anesthesia Evaluation  Patient identified by MRN, date of birth, ID band Patient confused    Reviewed: Allergy & Precautions, H&P , NPO status , Patient's Chart, lab work & pertinent test results  History of Anesthesia Complications Negative for: history of anesthetic complications  Airway Mallampati: I      Dental  (+) Poor Dentition, Missing, Chipped and Dental Advisory Given   Pulmonary neg pulmonary ROS,  breath sounds clear to auscultation        Cardiovascular + CAD, + Past MI and + Cardiac Stents Rhythm:Regular Rate:Normal     Neuro/Psych PSYCHIATRIC DISORDERS (dementia) Anxiety Depression    GI/Hepatic (+)     substance abuse (inactive now)  alcohol use,   Endo/Other    Renal/GU      Musculoskeletal   Abdominal   Peds  Hematology   Anesthesia Other Findings   Reproductive/Obstetrics                           Anesthesia Physical Anesthesia Plan  ASA: III  Anesthesia Plan: Spinal   Post-op Pain Management:    Induction:   Airway Management Planned: Nasal Cannula  Additional Equipment:   Intra-op Plan:   Post-operative Plan: Extubation in OR  Informed Consent:   Plan Discussed with:   Anesthesia Plan Comments: (Propofol infusion for  sedation.)        Anesthesia Quick Evaluation

## 2012-06-15 NOTE — Anesthesia Procedure Notes (Signed)
Spinal  Patient location during procedure: OR Start time: 06/15/2012 1:05 PM Staffing CRNA/Resident: Aleeya Veitch J Preanesthetic Checklist Completed: patient identified, site marked, surgical consent, pre-op evaluation, timeout performed, IV checked, risks and benefits discussed and monitors and equipment checked Spinal Block Patient position: right lateral decubitus Prep: Betadine Patient monitoring: heart rate, cardiac monitor, continuous pulse ox and blood pressure Approach: right paramedian Location: L2-3 Injection technique: single-shot Needle Needle type: Spinocan  Needle gauge: 22 G Assessment Sensory level: T10 Additional Notes [id number]       04/2013

## 2012-06-15 NOTE — Transfer of Care (Signed)
Immediate Anesthesia Transfer of Care Note  Patient: HIMMAT ENBERG  Procedure(s) Performed: Procedure(s) (LRB): MEATOTOMY ADULT (N/A) CYSTOSCOPY FLEXIBLE (N/A) HERNIA REPAIR INGUINAL ADULT (Right)  Patient Location: PACU  Anesthesia Type: Spinal  Level of Consciousness: awake, alert  and patient cooperative  Airway & Oxygen Therapy: Patient Spontanous Breathing and Patient connected to face mask oxygen  Post-op Assessment: Report given to PACU RN and Post -op Vital signs reviewed and stable  Post vital signs: Reviewed and stable  Complications: No apparent anesthesia complications

## 2012-06-15 NOTE — Progress Notes (Signed)
Patient stated that he was a 10 out of 10 pain. He said the pain medicine he was getting wasn't working long enough for him. Doctor was notified and new orders were given to increase pain medicine to 1mg  Dilaudid every 3 hours.

## 2012-06-15 NOTE — Anesthesia Postprocedure Evaluation (Signed)
  Anesthesia Post-op Note  Patient: Manuel Bradley  Procedure(s) Performed: Procedure(s) (LRB): MEATOTOMY ADULT (N/A) CYSTOSCOPY FLEXIBLE (N/A) HERNIA REPAIR INGUINAL ADULT (Right)  Patient Location: PACU  Anesthesia Type: Spinal  Level of Consciousness: awake, alert , oriented and patient cooperative  Airway and Oxygen Therapy: Patient Spontanous Breathing  Post-op Pain: 3 /10, mild  Post-op Assessment: Post-op Vital signs reviewed, Patient's Cardiovascular Status Stable, Respiratory Function Stable, Patent Airway and No signs of Nausea or vomiting  Post-op Vital Signs: Reviewed and stable  Complications: No apparent anesthesia complications

## 2012-06-15 NOTE — Op Note (Signed)
Patient:  Manuel Bradley  DOB:  08/26/1941  MRN:  [id number]   Preop Diagnosis:  Right inguinal hernia, incarcerated  Postop Diagnosis:  Same  Procedure:  Right anal herniorrhaphy  Surgeon:  Franky Macho, M.D.  Anes:  Spinal  Indications:  Patient is a 71 year old white male with multiple medical problems who presents with a very large incarcerated right inguinal hernia. He also has problems with urination is to undergo a procedure by Dr. Jerre Simon of urology. The risks and benefits of the procedure including bleeding, infection, and recurrence of the hernia were fully explained to the patient, gave informed consent.  Procedure note:  Patient had already had a spinal placed and had undergone a cystoscopy and meatomy by Dr. Jerre Simon of urology. The right groin was prepped and draped using usual sterile technique with Betadine. Surgical site confirmation was performed.  A transverse incision was made in the right groin region down to the external oblique aponeuroses. The external oblique aponeuroses was incised to the external ring. A large indirect hernia sac was seen in this could not be reduced. The spermatic cord was freed away from the hernia sac. The hernia contained a large amount of omentum and intestines. Ultimately, I was able to reduce its contents once the hernia sac was freed away at its peritoneal reflection from the spermatic cord. The hernia sac was inverted and a large size PerFix Bard plug was then placed in this region secured to the transversalis fascia using 2-0 Novafil interrupted sutures. An onlay patch was then placed along the floor of the inguinal canal and secured superiorly to the conjoined tendon and inferiorly to the shelving edge of Poupart's ligament using 2-0 Novafil interrupted sutures. The internal ring was recreated using a 2-0 Novafil interrupted suture. The external oblique aponeuroses was reapproximated using a 2-0 Vicryl running suture. The subcutaneous layer was  reapproximated using 3-0 Vicryl interrupted suture. 0.5% Sensorcaine was instilled the surrounding wound. The skin was closed using staples. Betadine ointment and dry sterile dressing were applied.  All tape and needle counts were correct at the end of the procedure. Patient was transferred to PACU in stable condition.  Complications:  None  EBL:  Minimal  Specimen:  None

## 2012-06-15 NOTE — Progress Notes (Signed)
Awake. Talking. No drainage from meatus. String from meatus in place. Denies pain.

## 2012-06-15 NOTE — Brief Op Note (Signed)
06/15/2012  1:33 PM  PATIENT:  Manuel Bradley  71 y.o. male  PRE-OPERATIVE DIAGNOSIS:  right inguinal hernia, meatal stenosis  POST-OPERATIVE DIAGNOSIS:  right inguinal hernia, meatal stenosis, mild benign prostatic hypertrophy  PROCEDURE:  Procedure(s) (LRB): MEATOTOMY ADULT (N/A) CYSTOSCOPY FLEXIBLE (N/A) HERNIA REPAIR INGUINAL ADULT (Right)  SURGEON:  Surgeon(s) and Role: Panel 1:    * Ky Barban, MD - Primary  Panel 2:    * Dalia Heading, MD - Primary  PHYSICIAN ASSISTANT:   ASSISTANTS: none   ANESTHESIA:   spinal  EBL:  Total I/O In: 200 [I.V.:200] Out: -   BLOOD ADMINISTERED:none  DRAINS: none   LOCAL MEDICATIONS USED:  NONE  SPECIMEN:  No Specimen  DISPOSITION OF SPECIMEN:  N/A  COUNTS:  YES  TOURNIQUET:  * No tourniquets in log *  DICTATION: .Other Dictation: Dictation Number [id number]  PLAN OF CARE: Discharge to home after PACU  PATIENT DISPOSITION:  PACU - hemodynamically stable.   Delay start of Pharmacological VTE agent (>24hrs) due to surgical blood loss or risk of bleeding:

## 2012-06-15 NOTE — Progress Notes (Signed)
Awake. Drinking sprite. Tolerated well. Sensation level at L1. Beginning to move bilateral toes.

## 2012-06-15 NOTE — H&P (Signed)
NAME:  Manuel Bradley, Manuel Bradley                 ACCOUNT NO.:  [id number]  MEDICAL RECORD NO.:  [id number]  LOCATION:                                 FACILITY:  PHYSICIAN:  Ky Barban, M.D.DATE OF BIRTH:  [birth year]  DATE OF ADMISSION:  06/15/2012 DATE OF DISCHARGE:  LH                             HISTORY & PHYSICAL   CHIEF COMPLAINT:  Voiding difficulty, slow and weak stream, meatal stenosis, and urinary leakage.  HISTORY:  A 71 year old gentleman who is a patient of Dr. Rito Ehrlich.  He was initially referred to him on July 17 last year by Ninfa Linden, family nurse practitioner of [facility].  He continued to have difficulty voiding.  The patient was evaluated by Dr. Rito Ehrlich last year. He needed to have meatal stenosis, so I happened to see him on June 25 of this year, and also has a large right groin swelling.  He was found to have hernia on that side, so he has a very severe meatal stenosis.  He needs meatotomy, and I will then do cystoscopy under anesthesia and Dr. Lovell Sheehan will do a right inguinal hernia repair.  He is coming tomorrow as outpatient.  As I said, Dr. Lovell Sheehan will do his hernia repair and I will do a meatotomy and cystoscopy under anesthesia.  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  He had a cardiac stent placement at that time, had phimosis and circumcision, revision in 2011; remote history of kidney stones, ORIF, right ankle fracture; scoliosis with leg length discrepancy, insomnia.  FAMILY HISTORY:  Positive for heart disease, kidney disease, arthritis, and cancer of unknown site.  PHYSICAL EXAMINATION:  VITAL SIGNS:  Blood pressure is 100/58, temperature is normal. CENTRAL NERVOUS SYSTEM:  No gross neurological deficit. HEAD, NECK, EYES, ENT:  Negative. CHEST:  Lungs clear to  auscultation. HEART:  Regular sinus rhythm.  No murmur. ABDOMEN:  Soft, flat.  Liver, spleen, kidneys are not palpable.  No CVA tenderness.  Bladder not palpable.  Penis circumcised.  Severe degree of meatal stenosis.  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  Testicles are normal. RECTAL:  Benign prostate about 45 g in size.  Has right reducible inguinal hernia.  Meatotomy and cystoscopy by me under anesthesia and right inguinal hernia repair by Dr. Lovell Sheehan.  It will be done as outpatient.     Ky Barban, M.D.     MIJ/MEDQ  D:  06/14/2012  T:  06/15/2012  Job:  [id number]  cc:   Ninfa Linden, FNP Fax: [phone]

## 2012-06-16 LAB — CBC
HCT: 35.9 % — ABNORMAL LOW (ref 39.0–52.0)
MCH: 33.1 pg (ref 26.0–34.0)
MCHC: 33.1 g/dL (ref 30.0–36.0)
RDW: 13.2 % (ref 11.5–15.5)

## 2012-06-16 LAB — BASIC METABOLIC PANEL
BUN: 10 mg/dL (ref 6–23)
Chloride: 98 mEq/L (ref 96–112)
Creatinine, Ser: 0.53 mg/dL (ref 0.50–1.35)
GFR calc Af Amer: >90 mL/min (ref >90)
Glucose, Bld: 108 mg/dL — ABNORMAL HIGH (ref 70–99)

## 2012-06-16 MED ORDER — OXYCODONE-ACETAMINOPHEN 7.5-325 MG PO TABS: 1.0000 | ORAL_TABLET | ORAL | Status: AC | PRN

## 2012-06-16 NOTE — Progress Notes (Signed)
Patient discharged with social worker and POA - Manuel Bradley.  Back to [facility].  Discharge instructions and prescriptions given to and reviewed with her.  Instructed on incision care.  Patient instructed and demonstrated how to use IS.  IV removed - WNL.  Follow up appts with Dr. Jerre Simon and Dr. Lovell Sheehan in place.  Patient or social worker have no questions at this time.  Belongings put in white bag and sent with patient,  Stable for discharge.  Have attempted to call [facility] to give a report on patient - no one answering at provided phone number.

## 2012-06-16 NOTE — Op Note (Signed)
NAME:  Manuel Bradley, Manuel Bradley                 ACCOUNT NO.:  [id number]  MEDICAL RECORD NO.:  [id number]  LOCATION:  A316                          FACILITY:  [facility]  PHYSICIAN:  Ky Barban, M.D.DATE OF BIRTH:  October 08, 1941  DATE OF PROCEDURE: DATE OF DISCHARGE:                              OPERATIVE REPORT   PREOPERATIVE DIAGNOSIS:  Meatal stenosis.  POSTOPERATIVE DIAGNOSIS:  Meatal stenosis, mild.  PROCEDURE:  Meatotomy and cystoscopy.  ANESTHESIA:  Spinal.  PROCEDURE IN DETAIL:  The patient was placed under spinal anesthesia in supine position, usual prep and drape.  Meatus was clamped in the midline of the meatus on the ventral side and wide open meatotomy was done and the margins of meatus were closed with 3-0 chromic stitch on each side and then the 3rd stitch was placed in the midline ventrally and I used 3-0 chromic catgut and one more stitch on the sidewall of the meatus was placed on each side, and wide open meatus was obtained and flexible cystoscope was introduced under direct vision.  Anterior urethra looks normal.  Prostatic urethra was partially obstructed with lateral lobe hypertrophy.  Bladder was 2+ trabeculated.  No tumor, stone, foreign body, or inflammation.  At this point, I left the operating room, and Dr. Lovell Sheehan took over the patient to do right inguinal hernia repair.  I had left a Foley catheter, which can be taken out after the end of the case.     Ky Barban, M.D.     MIJ/MEDQ  D:  06/15/2012  T:  06/16/2012  Job:  [id number]

## 2012-06-16 NOTE — Clinical Social Work Psychosocial (Signed)
Clinical Social Work Department BRIEF PSYCHOSOCIAL ASSESSMENT 06/16/2012  Patient:  Manuel Bradley, Manuel Bradley     Account Number:  [id number]     Admit date:  06/15/2012  Clinical Social Worker:  Nancie Neas  Date/Time:  06/16/2012 08:35 AM  Referred by:  RN  Date Referred:  06/16/2012 Referred for  ALF Placement   Other Referral:   Interview type:  Patient Other interview type:   and Megan- guardian    PSYCHOSOCIAL DATA Living Status:  FACILITY Admitted from facility:  [facility] Level of care:  Assisted Living Primary support name:  Megan Primary support relationship to patient:  NONE Degree of support available:   Aundra Millet is pt's guardian through [facility] DSS    CURRENT CONCERNS Current Concerns  Post-Acute Placement   Other Concerns:    SOCIAL WORK ASSESSMENT / PLAN CSW met with pt at bedside. Pt alert and oriented and reports he has been a resident at [facility] since Monday. Per Aundra Millet with DSS, pt has been a ward of the state for the past two months. CSW confirmed okay for pt to return with Lupita Leash at [facility]. She is agreeable to no FL2 as <24 hour admission. Aundra Millet will provide transporation back to facility today.   Assessment/plan status:  No Further Intervention Required Other assessment/ plan:   Information/referral to community resources:   [facility]    PATIENT'S/FAMILY'S RESPONSE TO PLAN OF CARE: Pt reports positive feelings regarding return to [facility]. No further needs reported. D/C summary, instructions, and prescription provided to facility.        Derenda Fennel, [place] [phone]

## 2012-06-16 NOTE — Addendum Note (Signed)
Addendum  created 06/16/12 1610 by Marolyn Hammock, CRNA   Modules edited:Notes Section

## 2012-06-16 NOTE — Anesthesia Postprocedure Evaluation (Signed)
  Anesthesia Post-op Note  Patient: Manuel Bradley  Procedure(s) Performed: Procedure(s) (LRB): MEATOTOMY ADULT (N/A) CYSTOSCOPY FLEXIBLE (N/A) HERNIA REPAIR INGUINAL ADULT (Right)  Patient Location: Room 316  Anesthesia Type: Spinal  Level of Consciousness: awake, alert  and patient cooperative  Airway and Oxygen Therapy: Patient Spontanous Breathing  Post-op Pain: mild  Post-op Assessment: Post-op Vital signs reviewed, Patient's Cardiovascular Status Stable, Respiratory Function Stable and Patent Airway  Post-op Vital Signs: Reviewed and stable  Complications: No apparent anesthesia complications

## 2012-06-16 NOTE — Discharge Summary (Signed)
Physician Discharge Summary  Patient ID: Manuel Bradley MRN: [id number] DOB/AGE: [birth year] 71 y.o.  Admit date: 06/15/2012 Discharge date: 06/16/2012  Admission Diagnoses: Incarcerated right inguinal hernia, urethral stenosis, COPD, senile dementia  Discharge Diagnoses: Same Active Problems:  * No active hospital problems. *    Discharged Condition: good  Hospital Course: Patient is a 71 year old white male who presented to [facility] hospital operating room for treatment of an incarcerated right inguinal hernia as well as urethral stenosis. He underwent a right anal herniorrhaphy as well as had meatotomy and cystoscopy on 06/15/2012. Due to pain and nausea, the patient was brought into the hospital overnight for extended recovery.  His diet was advanced at difficulty. He has been able to void. His nausea seems to have resolved. He is being discharged back to assisted living in good improving condition.  Treatments: surgery: Right inguinal herniorrhaphy by Dr. Franky Macho, meatotomy and cystoscopy by Dr. Jerre Simon on 06/15/2012  Discharge Exam: Blood pressure 99/62, pulse 60, temperature 97.6 F (36.4 C), temperature source Oral, resp. rate 20, height 5' 11.5" (1.816 m), weight 83.4 kg (183 lb 13.8 oz), SpO2 95.00%. General appearance: alert and cooperative Resp: clear to auscultation bilaterally Cardio: regular rate and rhythm, S1, S2 normal, no murmur, click, rub or gallop GI: Abdomen soft and flat. Incision healing well.  Disposition: 01-Home or Self Care   Medication List  As of 06/16/2012  7:54 AM   TAKE these medications         albuterol 108 (90 BASE) MCG/ACT inhaler   Commonly known as: PROVENTIL HFA;VENTOLIN HFA   Inhale 2 puffs into the lungs every 4 (four) hours as needed. For rescue/shortness of breath      aspirin EC 81 MG tablet   Take 81 mg by mouth daily.      fluticasone 50 MCG/ACT nasal spray   Commonly known as: FLONASE   Place 2 sprays into the nose daily.        folic acid 1 MG tablet   Commonly known as: FOLVITE   Take 1 mg by mouth daily.      hydrochlorothiazide 12.5 MG capsule   Commonly known as: MICROZIDE   Take 12.5 mg by mouth daily.      multivitamin tablet   Take 1 tablet by mouth daily.      oxyCODONE-acetaminophen 7.5-325 MG per tablet   Commonly known as: PERCOCET   Take 1 tablet by mouth every 4 (four) hours as needed for pain.      primidone 50 MG tablet   Commonly known as: MYSOLINE   Take 50 mg by mouth 2 (two) times daily.      Tamsulosin HCl 0.4 MG Caps   Commonly known as: FLOMAX   Take 0.4 mg by mouth daily.      thiamine 100 MG tablet   Commonly known as: VITAMIN B-1   Take 100 mg by mouth daily.      traZODone 150 MG tablet   Commonly known as: DESYREL   Take 150 mg by mouth at bedtime.           Follow-up Information    Follow up with Alleen Borne I, MD in 1 week.   Contact information:   [address] [place] [address] [phone]       Follow up with Dalia Heading, MD. Schedule an appointment as soon as possible for a visit on 06/28/2012.   Contact information:   [id number] [place]  [address] [phone]          Signed: Franky Macho A 06/16/2012, 7:54 AM

## 2012-06-16 NOTE — Progress Notes (Signed)
UR Chart Review Completed-- OIB 

## 2012-06-20 ENCOUNTER — Encounter (HOSPITAL_COMMUNITY): Payer: Self-pay | Admitting: Urology

## 2012-09-06 ENCOUNTER — Emergency Department: Payer: Self-pay | Admitting: Emergency Medicine

## 2012-09-07 LAB — COMPREHENSIVE METABOLIC PANEL
Bilirubin,Total: 0.5 mg/dL (ref 0.2–1.0)
Chloride: 106 mmol/L (ref 98–107)
Co2: 27 mmol/L (ref 21–32)
Creatinine: 0.62 mg/dL (ref 0.60–1.30)
EGFR (African American): >60
EGFR (Non-African Amer.): >60
Potassium: 3.4 mmol/L — ABNORMAL LOW (ref 3.5–5.1)
Sodium: 142 mmol/L (ref 136–145)

## 2012-09-07 LAB — CBC
HCT: 36.7 % — ABNORMAL LOW (ref 40.0–52.0)
Platelet: 251 10*3/uL (ref 150–440)
RDW: 13.7 % (ref 11.5–14.5)
WBC: 8.9 10*3/uL (ref 3.8–10.6)

## 2012-09-07 LAB — DRUG SCREEN, URINE
Barbiturates, Ur Screen: POSITIVE (ref ?–200)
Benzodiazepine, Ur Scrn: NEGATIVE (ref ?–200)
Cannabinoid 50 Ng, Ur ~~LOC~~: NEGATIVE (ref ?–50)
Cocaine Metabolite,Ur ~~LOC~~: NEGATIVE (ref ?–300)
Methadone, Ur Screen: NEGATIVE (ref ?–300)
Tricyclic, Ur Screen: NEGATIVE (ref ?–1000)

## 2012-09-07 LAB — ACETAMINOPHEN LEVEL: Acetaminophen: <2 ug/mL

## 2013-09-14 ENCOUNTER — Emergency Department: Payer: Self-pay | Admitting: Emergency Medicine

## 2013-09-14 LAB — CBC
HCT: 42.9 % (ref 40.0–52.0)
HGB: 14.2 g/dL (ref 13.0–18.0)
MCH: 32.3 pg (ref 26.0–34.0)
MCHC: 33 g/dL (ref 32.0–36.0)
MCV: 98 fL (ref 80–100)
Platelet: 212 10*3/uL (ref 150–440)
RBC: 4.38 10*6/uL — ABNORMAL LOW (ref 4.40–5.90)
RDW: 13.4 % (ref 11.5–14.5)
WBC: 13.7 10*3/uL — ABNORMAL HIGH (ref 3.8–10.6)

## 2013-09-14 LAB — COMPREHENSIVE METABOLIC PANEL
Albumin: 3.8 g/dL (ref 3.4–5.0)
BUN: 5 mg/dL — ABNORMAL LOW (ref 7–18)
Chloride: 103 mmol/L (ref 98–107)
Co2: 31 mmol/L (ref 21–32)
EGFR (Non-African Amer.): >60
Potassium: 3.7 mmol/L (ref 3.5–5.1)
Sodium: 140 mmol/L (ref 136–145)
Total Protein: 7.5 g/dL (ref 6.4–8.2)

## 2013-09-14 LAB — DRUG SCREEN, URINE
Amphetamines, Ur Screen: NEGATIVE (ref ?–1000)
Barbiturates, Ur Screen: NEGATIVE (ref ?–200)
Benzodiazepine, Ur Scrn: NEGATIVE (ref ?–200)
Cannabinoid 50 Ng, Ur ~~LOC~~: NEGATIVE (ref ?–50)
MDMA (Ecstasy)Ur Screen: NEGATIVE (ref ?–500)
Methadone, Ur Screen: NEGATIVE (ref ?–300)
Tricyclic, Ur Screen: NEGATIVE (ref ?–1000)

## 2013-09-14 LAB — TSH: Thyroid Stimulating Horm: 2.36 u[IU]/mL

## 2013-09-15 LAB — URINALYSIS, COMPLETE
Blood: NEGATIVE
Ketone: NEGATIVE
Leukocyte Esterase: NEGATIVE
Nitrite: NEGATIVE
Protein: 100
RBC,UR: <1 /HPF (ref 0–5)
Specific Gravity: 1.017 (ref 1.003–1.030)

## 2014-07-06 IMAGING — CR DG CHEST 1V PORT
1 series · 1 of 1 positions shown · non-contrast
Comparison: none

REASON FOR EXAM: placement
COMMENTS:

[ap]
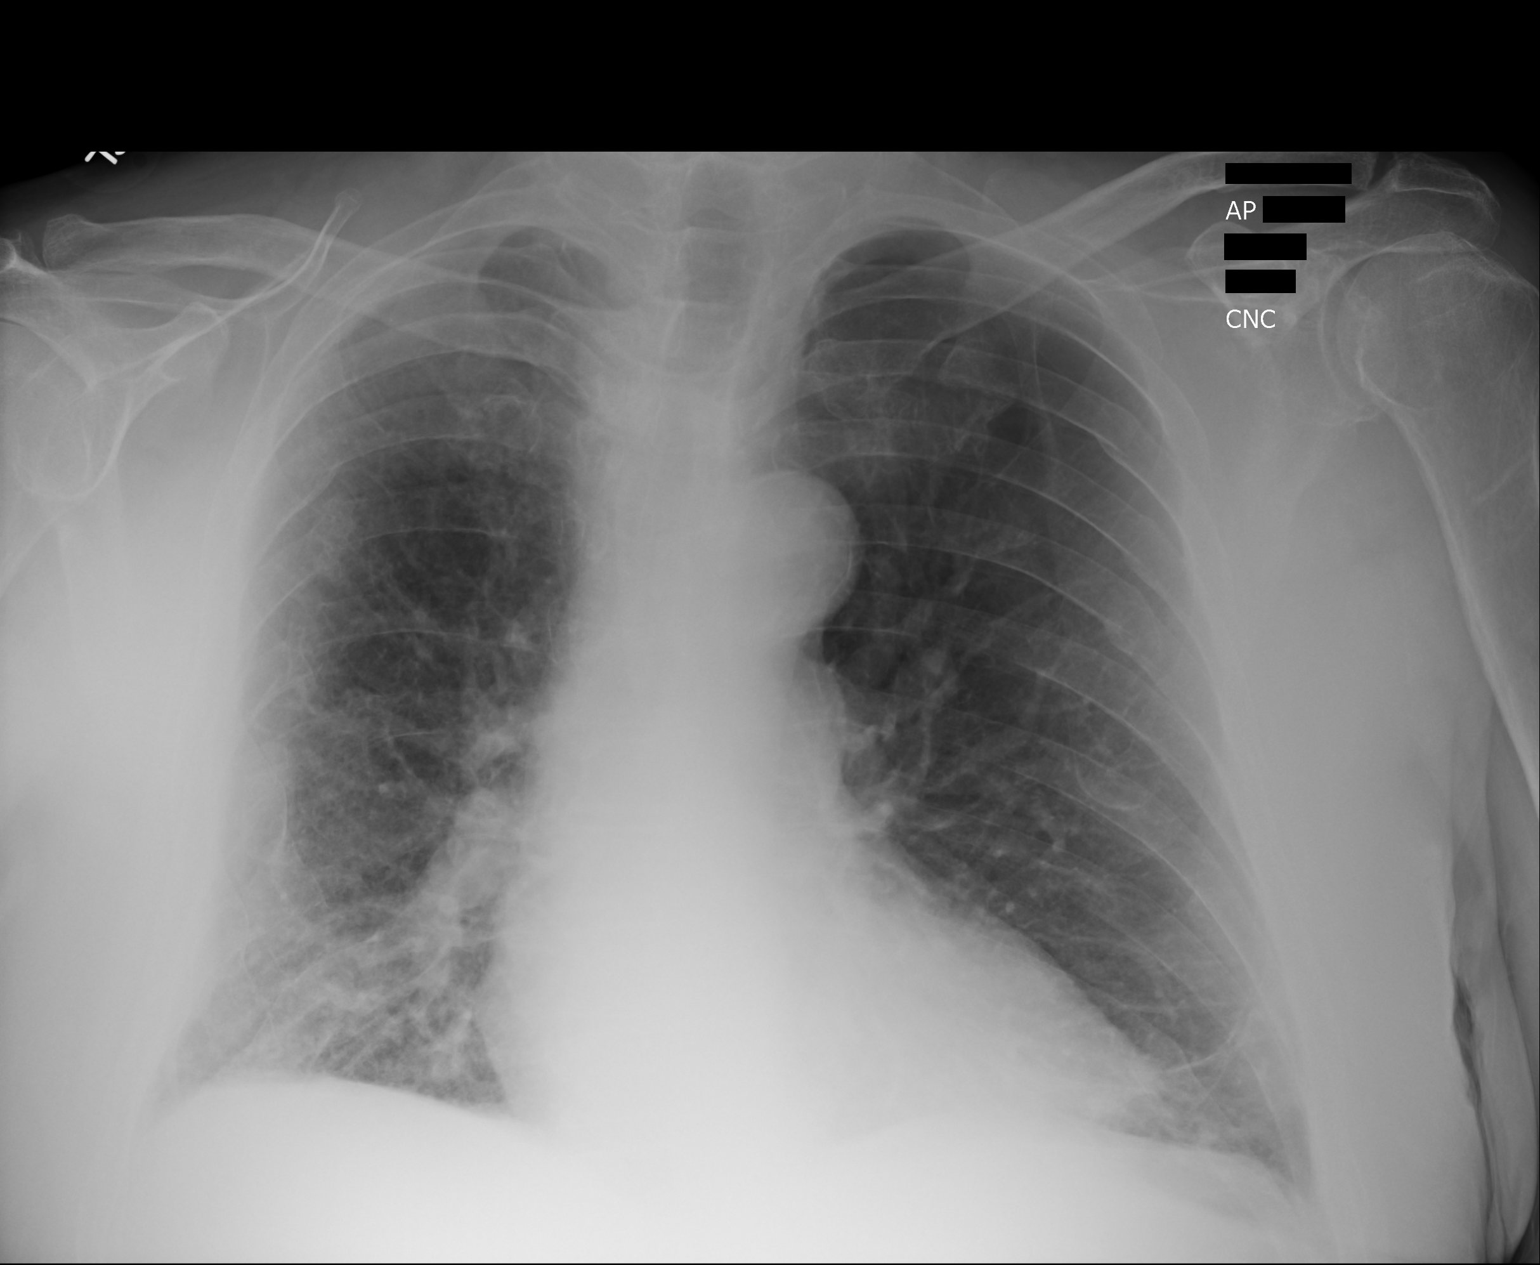

[1 of 1 positions shown; findings below may reference images not displayed]

PROCEDURE:     DXR - DXR PORTABLE CHEST SINGLE VIEW  - September 15, 2013 [DATE]

RESULT:     Comparison made to prior study of 09/07/2012. Atelectasis versus
pneumonia both lung bases. Cardiomegaly. Pulmonary vascularity normal. Mild
interstitial infiltrates versus atelectasis lung bases. Mild pneumonia
cannot be excluded. Old right rib fractures.
IMPRESSION: Mild atelectasis and/or infiltrates in the lung bases.
Pneumonia cannot be excluded. Interstitial prominence is present and could
be related to chronic interstitial lung disease. Similar findings noted on
prior study of 09/07/2012.

## 2014-07-19 ENCOUNTER — Emergency Department: Payer: Self-pay | Admitting: Emergency Medicine
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
## 2015-09-24 DEATH — deceased
